# Patient Record
Sex: Female | Born: 1965 | Race: White | Hispanic: No | Marital: Married | Smoking: Never smoker
Health system: Southern US, Community
[De-identification: ages and names within clinical notes are randomized; demographics above are authoritative.]

## PROBLEM LIST (undated history)

## (undated) HISTORY — PX: CHOLECYSTECTOMY: SHX55

## (undated) HISTORY — PX: HERNIA REPAIR: SHX51

---

## 2004-10-04 ENCOUNTER — Ambulatory Visit: Payer: Self-pay

## 2007-05-01 ENCOUNTER — Ambulatory Visit: Payer: Self-pay

## 2008-08-10 ENCOUNTER — Ambulatory Visit: Payer: Self-pay

## 2009-08-18 ENCOUNTER — Ambulatory Visit: Payer: Self-pay

## 2011-11-06 ENCOUNTER — Ambulatory Visit: Payer: Self-pay

## 2013-07-13 ENCOUNTER — Inpatient Hospital Stay: Payer: Self-pay | Admitting: Surgery

## 2013-07-13 LAB — CBC
HGB: 12.1 g/dL (ref 12.0–16.0)
MCH: 29 pg (ref 26.0–34.0)
MCHC: 33.3 g/dL (ref 32.0–36.0)
MCV: 87 fL (ref 80–100)
RBC: 4.17 10*6/uL (ref 3.80–5.20)

## 2013-07-13 LAB — URINALYSIS, COMPLETE
Bilirubin,UR: NEGATIVE
Blood: NEGATIVE
Glucose,UR: NEGATIVE mg/dL (ref 0–75)
Ketone: NEGATIVE
Leukocyte Esterase: NEGATIVE
Ph: 6 (ref 4.5–8.0)
RBC,UR: <1 /HPF (ref 0–5)
Squamous Epithelial: 5
WBC UR: 4 /HPF (ref 0–5)

## 2013-07-13 LAB — COMPREHENSIVE METABOLIC PANEL
Albumin: 2.8 g/dL — ABNORMAL LOW (ref 3.4–5.0)
Alkaline Phosphatase: 115 U/L (ref 50–136)
BUN: 12 mg/dL (ref 7–18)
Calcium, Total: 8.8 mg/dL (ref 8.5–10.1)
Chloride: 106 mmol/L (ref 98–107)
EGFR (African American): >60
Glucose: 125 mg/dL — ABNORMAL HIGH (ref 65–99)
SGOT(AST): 16 U/L (ref 15–37)
Total Protein: 7.3 g/dL (ref 6.4–8.2)

## 2013-07-14 LAB — CBC WITH DIFFERENTIAL/PLATELET
Basophil #: 0.1 10*3/uL (ref 0.0–0.1)
Eosinophil #: 0.3 10*3/uL (ref 0.0–0.7)
HGB: 10.7 g/dL — ABNORMAL LOW (ref 12.0–16.0)
Lymphocyte #: 2.8 10*3/uL (ref 1.0–3.6)
Lymphocyte %: 29.1 %
Monocyte #: 0.7 x10 3/mm (ref 0.2–0.9)
Neutrophil #: 5.8 10*3/uL (ref 1.4–6.5)
Neutrophil %: 60.1 %
Platelet: 249 10*3/uL (ref 150–440)
WBC: 9.6 10*3/uL (ref 3.6–11.0)

## 2013-07-14 LAB — PREGNANCY, URINE: Pregnancy Test, Urine: NEGATIVE m[IU]/mL

## 2013-07-14 LAB — BASIC METABOLIC PANEL
BUN: 11 mg/dL (ref 7–18)
Co2: 25 mmol/L (ref 21–32)
EGFR (Non-African Amer.): >60
Potassium: 3.7 mmol/L (ref 3.5–5.1)

## 2013-07-16 LAB — PATHOLOGY REPORT

## 2014-01-18 ENCOUNTER — Ambulatory Visit: Payer: Self-pay | Admitting: Surgery

## 2014-02-04 ENCOUNTER — Ambulatory Visit: Payer: Self-pay | Admitting: Anesthesiology

## 2014-02-04 LAB — POTASSIUM: Potassium: 3.9 mmol/L (ref 3.5–5.1)

## 2014-03-04 ENCOUNTER — Ambulatory Visit: Payer: Self-pay | Admitting: Surgery

## 2015-04-15 NOTE — H&P (Signed)
History of Present Illness 49 yowf w/ a 5 day h/o constant RUQ pain and nausea. Pain was severe 5 days ago and WBC at [facility] ED was 14. Pain improved, but constant. Vomited a lot 5 days ago, but no vomiting since. Has been on Augmentin 875/125 since ED visit. Has had a low grade fever (< 100.0), but denies change in th color of her urine, stool, skin, or eyes. Had a similar episode 10 years ago when she had sludge, but no stones.   Past History MO Peripheral edema   Past Med/Surgical Hx:  ankle edema:   edema: pedal  none:   none:   ALLERGIES:  NKA: None  Iodine: Anaphylaxis  Shellfish: Anaphylaxis  HOME MEDICATIONS: Medication Instructions Status  hydrochlorothiazide 12.5 mg oral tablet 1 tab(s) orally once a day Active   Family and Social History:  Family History Non-Contributory   Social History negative tobacco, negative ETOH, married, 20 yo son at home, works at [employer]   Review of Systems:  Fever/Chills Yes   Cough No   Sputum No   Abdominal Pain Yes   Diarrhea No   Constipation No   Nausea/Vomiting Yes   SOB/DOE No   Chest Pain No   Dysuria No   Tolerating PT Yes   Tolerating Diet Yes  Nauseated   Medications/Allergies Reviewed Medications/Allergies reviewed   Physical Exam:  GEN well developed, well nourished, no acute distress, obese   HEENT pink conjunctivae, PERRL, hearing intact to voice, moist oral mucosa, Oropharynx clear   RESP normal resp effort  clear BS  no use of accessory muscles   CARD regular rate  no murmur  no JVD  no Rub   ABD positive tenderness  R sided tenderness - severe   LYMPH negative neck   SKIN normal to palpation, skin turgor good   NEURO cranial nerves intact, follows commands, motor/sensory function intact   PSYCH alert, A+O to time, place, person, good insight   Lab Results: Hepatic:  21-Jul-14 14:43   Bilirubin, Total 0.2  Alkaline Phosphatase 115  SGPT (ALT) 15  SGOT  (AST) 16  Total Protein, Serum 7.3  Albumin, Serum  2.8  Routine Chem:  21-Jul-14 14:43   Glucose, Serum  125  BUN 12  Creatinine (comp) 0.67  Sodium, Serum 139  Potassium, Serum  3.2  Chloride, Serum 106  CO2, Serum 30  Calcium (Total), Serum 8.8  Osmolality (calc) 279  eGFR (African American) >60  eGFR (Non-African American) >60 (eGFR values <78m/min/1.73 m2 may be an indication of chronic kidney disease (CKD). Calculated eGFR is useful in patients with stable renal function. The eGFR calculation will not be reliable in acutely ill patients when serum creatinine is changing rapidly. It is not useful in  patients on dialysis. The eGFR calculation may not be applicable to patients at the low and high extremes of body sizes, pregnant women, and vegetarians.)  Anion Gap  3  Lipase 127 (Result(s) reported on 13 Jul 2013 at 03:13PM.)  Routine UA:  21-Jul-14 18:20   Color (UA) Yellow  Clarity (UA) Hazy  Glucose (UA) Negative  Bilirubin (UA) Negative  Ketones (UA) Negative  Specific Gravity (UA) 1.021  Blood (UA) Negative  pH (UA) 6.0  Protein (UA) Negative  Nitrite (UA) Negative  Leukocyte Esterase (UA) Negative (Result(s) reported on 13 Jul 2013 at 06:48PM.)  RBC (UA) <1 /HPF  WBC (UA) 4 /HPF  Bacteria (UA) TRACE  Epithelial  Cells (UA) 5 /HPF  Mucous (UA) PRESENT (Result(s) reported on 13 Jul 2013 at 06:48PM.)  Routine Hem:  21-Jul-14 14:43   WBC (CBC)  11.6  RBC (CBC) 4.17  Hemoglobin (CBC) 12.1  Hematocrit (CBC) 36.3  Platelet Count (CBC) 284 (Result(s) reported on 13 Jul 2013 at 03:06PM.)  MCV 87  MCH 29.0  MCHC 33.3  RDW 13.3   Radiology Results: [place]:    21-Jul-14 18:00, US Abdomen Limited Survey  US Abdomen Limited Survey  REASON FOR EXAM:    RUQ pain  COMMENTS:   Body Site: GB and Fossa, CBD, Head of Pancreas    PROCEDURE: [place]  - US ABDOMEN LIMITED SURVEY  - Jul 13 2013  6:00PM     RESULT: Comparison: None.    Technique: Multiple grayscale and color  Doppler images wereobtained of   the right upper quadrant.    Findings:  The visualized pancreas is unremarkable. The pancreatic tail was obscured   by overlying bowel gas. The visualized liver is unremarkable. The main   portal vein is patent. The common bile duct measures 5 mm in diameter.   Multiple stones are demonstrated in the gallbladder. By report from the     ultrasound technologist, some were nonmobile in the gallbladder neck. No   gallbladder wall thickening or pericholecystic fluid. The sonographic   Percell Miller sign could not be accurately assessed secondary to the patient's   medicated state.    IMPRESSION:   Cholelithiasis. The sonographic Percell Miller sign could not be accurately   assessed. However, there was no gallbladder wall thickening or   pericholecystic fluid. Clinical correlation is suggested.       Dictation Site: 8        Verified By: Gregor Hams, M.D., MD  LabUnknown:  PACS Image    Assessment/Admission Diagnosis Subacute cholecystitis Hypokalemia   Plan Admit Replace K+ IV ABx Lap CCY tomorrow (pt aware of risk of opening, and 1/200 risk of CBD injury and wishes to proceed).   Electronic Signatures: Consuela Mimes (MD)  (Signed 21-Jul-14 19:53)  Authored: CHIEF COMPLAINT and HISTORY, PAST MEDICAL/SURGIAL HISTORY, ALLERGIES, HOME MEDICATIONS, FAMILY AND SOCIAL HISTORY, REVIEW OF SYSTEMS, PHYSICAL EXAM, LABS, Radiology, ASSESSMENT AND PLAN   Last Updated: 21-Jul-14 19:53 by Consuela Mimes (MD)

## 2015-04-15 NOTE — Discharge Summary (Signed)
PATIENT NAME:  Sarah Nixon, [name] MR#:  [id number] DATE OF BIRTH:  1966-12-01  DATE OF ADMISSION:  07/13/2013 DATE OF DISCHARGE:  07/16/2013  DISCHARGE DIAGNOSES:   1.  Acute cholecystitis status post laparoscopic cholecystectomy.  2.  History of peripheral edema.   DISCHARGE MEDICATIONS: 1.  Hydrochlorothiazide 12.5 p.o. daily.  2.  Percocet 1 to 2 tabs p.o. q.4 hours p.r.n. pain.  3.  Ibuprofen 600 mg t.i.d.   INDICATION FOR ADMISSION: Ms. [name] is a pleasant 49 year old female who presents with five days of right upper quadrant pain, which did not improved with antibiotics. She had an ultrasound which was consistent for cholelithiasis and a white blood cell count of 11.6. She was thus brought to the operating room suite for laparoscopic cholecystectomy.   HOSPITAL COURSE: She was admitted on July 21, resuscitated overnight. On July 22 underwent laparoscopic cholecystectomy. Postoperatively, there was some pain control issues. A JP output was recorded July 24. She was deemed suitable for discharge and was discharged home in satisfactory condition. She was tolerating good p.o. with good p.o. pain control and that time.   INSTRUCTIONS FOR DISCHARGE:  Ms. [name] is to be discharged with instructions to follow up with me in approximately one week. She is to call or return to the ED if she has increased abdominal pain, nausea, vomiting, redness, drainage from incision.   ____________________________ Si Raiderhristopher A. Loletha Bertini, MD cal:cc D: 07/27/2013 20:51:12 ET T: 07/27/2013 23:17:23 ET JOB#: [id number]  cc: Cristal Deerhristopher A. Carmichael Burdette, MD, <Dictator> Jarvis NewcomerHRISTOPHER A Tilford Deaton MD ELECTRONICALLY SIGNED 07/30/2013 20:32

## 2015-04-15 NOTE — Op Note (Signed)
PATIENT NAME:  Sarah Nixon, Sarah Nixon MR#:  [id number] DATE OF BIRTH:  1966/09/25  DATE OF PROCEDURE:  07/14/2013  ATTENDING PHYSICIAN:  Dr. Salome Holmeshris Motty Borin.   PREOPERATIVE DIAGNOSIS: Acute cholecystitis.   POSTOPERATIVE DIAGNOSIS:  Acute cholecystitis.  PROCEDURE PERFORMED: Laparoscopic cholecystectomy.   ANESTHESIA: General.   SPECIMENS: Gallbladder.   ESTIMATED BLOOD LOSS: 75 mL.   COMPLICATIONS: None.   INDICATION FOR SURGERY: [name] is a pleasant 49 year old female who presents with persistent right upper quadrant pain, leukocytosis and gallstones on physical exam.  She was admitted for a cholecystectomy.   DETAILS OF PROCEDURE: Is as follows:  Informed consent was obtained from Sarah Nixon. She was laid supine on the operating room table. She was induced. An endotracheal tube was placed, general anesthesia was administered. Her abdomen was prepped and draped in standard surgical fashion. A timeout was then performed correctly identifying the patient name, operative site and procedure to be performed. Supraumbilical incision was made. This was deepened down to the fascia. The fascia was incised. The peritoneum was entered. Two stay sutures were placed through the fasciotomy.  Hasson trocar was placed in the abdomen. The abdomen was insufflated. The gallbladder was visualized and noted to be hemorrhagic with a rind around it. An  11 mm subxiphoid port was placed under direct visualization, and two 5 mm right subcostal ports at the midclavicular and anterior axillary line. The gallbladder was then aspirated so that it  could be grasped. It was then retracted over the dome of the liver. The cystic artery and cystic duct were dissected out. A critical view was obtained. The artery was small and, nevertheless, clipped, as well as the duct, and both were ligated. I then began removing the gallbladder off the gallbladder fossa. A second arterial structure, which was found to be entering the gallbladder, was  then encountered and clipped and ligated. The gallbladder was then taken off the gallbladder fossa. It was friable and attempts were made not to peel off directly, but it did somewhat peel off, and there was some oozing in the gallbladder fossa. The gallbladder was then taken out through the umbilical port site with an Endo Catch bag. The port site did have to be significantly enlarged as well as the fascia to be able to remove the gallbladder. The gallbladder fossa was then inspected and noted to be oozing, which was cauterized and later Surgiflo was placed to provide hemostasis. After I was sure hemostasis occurred, due to the friability of the gallbladder and the oozing of the gallbladder fossa, I did place a JP drain through the lateralmost port site into the gallbladder fossa, sutured in place with 3-0 nylon suture. After I was satisfied with hemostasis, I removed all port sites under direct visualization. The supraumbilical port site fascia was closed with a 0 Vicryl on a UR-6. All skin incisions were then closed with 4-0 deep dermal Monocryl. Steri-Strips, Telfa gauze and Tegaderm were then placed over all the wounds. The patient was then awoken, extubated and brought to the postanesthesia care unit. There were no immediate complications. Needle, sponge, and instrument counts were correct at the end of the procedure.    ____________________________ Si Raiderhristopher A. Cairo Agostinelli, MD cal:dmm D: 07/15/2013 12:18:30 ET T: 07/15/2013 12:35:52 ET JOB#: [id number]  cc: Cristal Deerhristopher A. Kierstyn Baranowski, MD, <Dictator> Jarvis NewcomerHRISTOPHER A Rifky Lapre MD ELECTRONICALLY SIGNED 07/18/2013 11:35

## 2015-04-16 NOTE — Op Note (Signed)
PATIENT NAME:  Sarah Nixon, Sarah Nixon MR#:  [id number] DATE OF BIRTH:  [birth year]  DATE OF PROCEDURE:  03/04/2014  ATTENDING PHYSICIAN:  Cristal Deerhristopher A. Gilmore List, MD  PREOPERATIVE DIAGNOSIS:  Incisional hernia.   POSTOPERATIVE DIAGNOSIS:  Incisional hernia.   PROCEDURE PERFORMED:  Open incisional hernia repair with mesh.   ANESTHESIA:  General.   ESTIMATED BLOOD LOSS:  15 mL.   COMPLICATIONS:  None.   SPECIMENS:  None.   INDICATION FOR SURGERY:  Sarah Nixon is a pleasant 49 year old female with a history of laparoscopic cholecystectomy who presented with a significant incisional hernia. The decision was made to repair her incisional hernia.   DETAILS OF PROCEDURE:  Informed consent was obtained. Sarah Nixon was brought to the Operating Room suite. She was laid supine on the Operating Room table. She was induced. Endotracheal tube was placed, general anesthesia was administered. Her abdomen was then prepped and draped in standard surgical fashion. A time-out was then performed correctly identifying the patient name, operative site and procedure to be performed. The hernia was then palpated. It was easily reduced. The fascial defect was palpated. I then made an incision over the fascial defect. The incision was deepened down to the hernia sac. The hernia sac was then surrounded and opened. There was some omentum stuck to the inside and this was carefully removed from the sac. The sac was then reduced. The fascia was then cleared off. The defect was measured. It was 3 x 3 cm. The decision was made then place an 8-cm Ventralex mesh. This was placed deep into the abdominal cavity. The defect was then closed. All of her __________ held in the pocket of the incision using an interrupted 0 Ethibond in a longitudinal orientation. The sutures then tied. The tails were then clipped off. The repair was then examined and noted to be hemostatic. The wound was then closed in 3 layers, a deep layer of interrupted 3-0 Vicryl  and a 3-0 Vicryl deep dermal and 4-0 Monocryl subcuticular. Steri-Strips, Telfa gauze and then Tegaderm were used to complete dressing. The patient was then awoken, extubated and brought to the postanesthesia care unit. There were no immediate complications. Needle, sponge, and instrument counts were correct at the end of the procedure.   ____________________________ Si Raiderhristopher A. Luwanda Starr, MD cal:jm D: 03/05/2014 19:20:04 ET T: 03/06/2014 12:28:13 ET JOB#: [id number]  cc: Cristal Deerhristopher A. Digby Groeneveld, MD, <Dictator> Jarvis NewcomerHRISTOPHER A Meadow Abramo MD ELECTRONICALLY SIGNED 03/07/2014 19:23

## 2015-08-18 ENCOUNTER — Other Ambulatory Visit: Payer: Self-pay | Admitting: Obstetrics and Gynecology

## 2015-08-18 DIAGNOSIS — Z1231 Encounter for screening mammogram for malignant neoplasm of breast: Secondary | ICD-10-CM

## 2015-08-23 ENCOUNTER — Ambulatory Visit
Admission: RE | Admit: 2015-08-23 | Discharge: 2015-08-23 | Disposition: A | Discharge status: Home / Self Care | Payer: 59 | Source: Ambulatory Visit | Attending: Obstetrics and Gynecology | Admitting: Obstetrics and Gynecology

## 2015-08-23 DIAGNOSIS — Z1231 Encounter for screening mammogram for malignant neoplasm of breast: Secondary | ICD-10-CM

## 2015-10-27 ENCOUNTER — Other Ambulatory Visit: Payer: Self-pay | Admitting: Internal Medicine

## 2015-10-27 DIAGNOSIS — R519 Headache, unspecified: Secondary | ICD-10-CM

## 2015-10-27 DIAGNOSIS — R51 Headache: Principal | ICD-10-CM

## 2015-11-09 ENCOUNTER — Ambulatory Visit
Admission: RE | Admit: 2015-11-09 | Discharge: 2015-11-09 | Disposition: A | Discharge status: Home / Self Care | Payer: 59 | Source: Ambulatory Visit | Attending: Internal Medicine | Admitting: Internal Medicine

## 2015-11-09 ENCOUNTER — Other Ambulatory Visit: Payer: Self-pay | Admitting: Internal Medicine

## 2015-11-09 DIAGNOSIS — R519 Headache, unspecified: Secondary | ICD-10-CM

## 2015-11-09 DIAGNOSIS — R51 Headache: Secondary | ICD-10-CM | POA: Diagnosis not present

## 2015-11-09 MED ORDER — GADOBENATE DIMEGLUMINE 529 MG/ML IV SOLN: 20.0000 mL | Freq: Once | INTRAVENOUS | Status: DC | PRN

## 2016-08-21 ENCOUNTER — Other Ambulatory Visit: Payer: Self-pay | Admitting: Obstetrics and Gynecology

## 2016-08-21 DIAGNOSIS — Z1231 Encounter for screening mammogram for malignant neoplasm of breast: Secondary | ICD-10-CM

## 2016-09-10 ENCOUNTER — Ambulatory Visit
Admission: RE | Admit: 2016-09-10 | Discharge: 2016-09-10 | Disposition: A | Discharge status: Home / Self Care | Payer: 59 | Source: Ambulatory Visit | Attending: Obstetrics and Gynecology | Admitting: Obstetrics and Gynecology

## 2016-09-10 DIAGNOSIS — Z1231 Encounter for screening mammogram for malignant neoplasm of breast: Secondary | ICD-10-CM | POA: Diagnosis not present

## 2017-08-21 ENCOUNTER — Other Ambulatory Visit: Payer: Self-pay | Admitting: Obstetrics and Gynecology

## 2017-08-21 DIAGNOSIS — Z1231 Encounter for screening mammogram for malignant neoplasm of breast: Secondary | ICD-10-CM

## 2017-09-18 ENCOUNTER — Ambulatory Visit
Admission: RE | Admit: 2017-09-18 | Discharge: 2017-09-18 | Disposition: A | Discharge status: Home / Self Care | Payer: 59 | Source: Ambulatory Visit | Attending: Obstetrics and Gynecology | Admitting: Obstetrics and Gynecology

## 2017-09-18 DIAGNOSIS — Z1231 Encounter for screening mammogram for malignant neoplasm of breast: Secondary | ICD-10-CM | POA: Diagnosis not present

## 2018-10-14 ENCOUNTER — Other Ambulatory Visit: Payer: Self-pay | Admitting: Obstetrics and Gynecology

## 2018-10-14 DIAGNOSIS — Z1231 Encounter for screening mammogram for malignant neoplasm of breast: Secondary | ICD-10-CM

## 2018-12-29 ENCOUNTER — Ambulatory Visit
Admission: RE | Admit: 2018-12-29 | Discharge: 2018-12-29 | Disposition: A | Discharge status: Home / Self Care | Payer: Managed Care, Other (non HMO) | Source: Ambulatory Visit | Attending: Obstetrics and Gynecology | Admitting: Obstetrics and Gynecology

## 2018-12-29 DIAGNOSIS — Z1231 Encounter for screening mammogram for malignant neoplasm of breast: Secondary | ICD-10-CM | POA: Insufficient documentation

## 2019-10-15 ENCOUNTER — Other Ambulatory Visit: Payer: Self-pay

## 2019-10-15 DIAGNOSIS — Z20822 Contact with and (suspected) exposure to covid-19: Secondary | ICD-10-CM

## 2019-10-17 LAB — NOVEL CORONAVIRUS, NAA: SARS-CoV-2, NAA: DETECTED — AB

## 2020-09-29 ENCOUNTER — Other Ambulatory Visit: Payer: Self-pay | Admitting: Internal Medicine

## 2020-09-29 ENCOUNTER — Other Ambulatory Visit: Payer: Self-pay | Admitting: Obstetrics and Gynecology

## 2020-09-29 DIAGNOSIS — Z1231 Encounter for screening mammogram for malignant neoplasm of breast: Secondary | ICD-10-CM

## 2020-11-01 ENCOUNTER — Ambulatory Visit
Admission: RE | Admit: 2020-11-01 | Discharge: 2020-11-01 | Disposition: A | Discharge status: Home / Self Care | Payer: Managed Care, Other (non HMO) | Source: Ambulatory Visit | Attending: Obstetrics and Gynecology | Admitting: Obstetrics and Gynecology

## 2020-11-01 ENCOUNTER — Other Ambulatory Visit: Payer: Self-pay

## 2020-11-01 DIAGNOSIS — Z1231 Encounter for screening mammogram for malignant neoplasm of breast: Secondary | ICD-10-CM | POA: Diagnosis present

## 2020-11-08 ENCOUNTER — Other Ambulatory Visit: Payer: Self-pay | Admitting: Obstetrics and Gynecology

## 2020-11-08 DIAGNOSIS — N6489 Other specified disorders of breast: Secondary | ICD-10-CM

## 2020-11-08 DIAGNOSIS — R928 Other abnormal and inconclusive findings on diagnostic imaging of breast: Secondary | ICD-10-CM

## 2020-12-06 ENCOUNTER — Telehealth: Payer: Self-pay | Admitting: *Deleted

## 2020-12-06 NOTE — Telephone Encounter (Signed)
CALLED PT AGAIN REGARDING AV / MAMMO . SHE IS AWARE OF AV - BUT AT THIS TIME SHE DOES NOT WANT TO COME BACK IN FOR THE AV - IF SHE CHANGES HER MIND SHE WCB - SHE SAID SHE HAS ALREADY DISCUSSED THIS WITH HER OBGYN

## 2022-01-03 ENCOUNTER — Other Ambulatory Visit: Payer: Self-pay | Admitting: Obstetrics and Gynecology

## 2022-01-03 DIAGNOSIS — N6489 Other specified disorders of breast: Secondary | ICD-10-CM

## 2022-01-29 ENCOUNTER — Ambulatory Visit
Admission: RE | Admit: 2022-01-29 | Discharge: 2022-01-29 | Disposition: A | Discharge status: Home / Self Care | Payer: Managed Care, Other (non HMO) | Source: Ambulatory Visit | Attending: Obstetrics and Gynecology | Admitting: Obstetrics and Gynecology

## 2022-01-29 ENCOUNTER — Other Ambulatory Visit: Payer: Self-pay

## 2022-01-29 DIAGNOSIS — N6489 Other specified disorders of breast: Secondary | ICD-10-CM | POA: Diagnosis not present

## 2022-05-11 ENCOUNTER — Ambulatory Visit: Payer: Managed Care, Other (non HMO) | Admitting: Anesthesiology

## 2022-05-11 ENCOUNTER — Encounter
Admission: RE | Disposition: A | Discharge status: Home / Self Care | Payer: Self-pay | Source: Home / Self Care | Attending: Gastroenterology

## 2022-05-11 ENCOUNTER — Ambulatory Visit
Admission: RE | Admit: 2022-05-11 | Discharge: 2022-05-11 | Disposition: A | Discharge status: Home / Self Care | Payer: Managed Care, Other (non HMO) | Attending: Gastroenterology | Admitting: Gastroenterology

## 2022-05-11 ENCOUNTER — Encounter: Payer: Self-pay | Admitting: Gastroenterology

## 2022-05-11 DIAGNOSIS — K635 Polyp of colon: Secondary | ICD-10-CM | POA: Insufficient documentation

## 2022-05-11 DIAGNOSIS — K640 First degree hemorrhoids: Secondary | ICD-10-CM | POA: Diagnosis not present

## 2022-05-11 DIAGNOSIS — Z1211 Encounter for screening for malignant neoplasm of colon: Secondary | ICD-10-CM | POA: Diagnosis present

## 2022-05-11 DIAGNOSIS — K5730 Diverticulosis of large intestine without perforation or abscess without bleeding: Secondary | ICD-10-CM | POA: Diagnosis not present

## 2022-05-11 DIAGNOSIS — Z6841 Body Mass Index (BMI) 40.0 and over, adult: Secondary | ICD-10-CM | POA: Diagnosis not present

## 2022-05-11 HISTORY — PX: COLONOSCOPY WITH PROPOFOL: SHX5780

## 2022-05-11 SURGERY — COLONOSCOPY WITH PROPOFOL
Anesthesia: General

## 2022-05-11 MED ORDER — SODIUM CHLORIDE 0.9 % IV SOLN: INTRAVENOUS | Status: DC

## 2022-05-11 MED ORDER — PROPOFOL 10 MG/ML IV BOLUS
INTRAVENOUS | Status: DC | PRN
  Administered 2022-05-11: 100 mg via INTRAVENOUS
  Administered 2022-05-11 (×6): 40 mg via INTRAVENOUS
  Administered 2022-05-11 (×2): 50 mg via INTRAVENOUS
  Administered 2022-05-11: 40 mg via INTRAVENOUS

## 2022-05-11 NOTE — H&P (Signed)
Pre-Procedure H&P   Patient ID: Sarah Nixon is a 56 y.o. female.  Gastroenterology Provider: Jaynie Collins, DO  Referring Provider: Dr. Hyacinth Meeker PCP: Danella Penton, MD  Date: 05/11/2022  HPI [name] is a 56 y.o. female who presents today for Colonoscopy for Initial screening colonoscopy. Normal bm w/o melena hematochezia diarrhea or constipation No fhx crc or colon polyps.  History reviewed. No pertinent past medical history.  Past Surgical History:  Procedure Laterality Date   CHOLECYSTECTOMY     HERNIA REPAIR      Family History No h/o GI disease or malignancy  Review of Systems  Constitutional:  Negative for activity change, appetite change, chills, diaphoresis, fatigue, fever and unexpected weight change.  HENT:  Negative for trouble swallowing and voice change.   Respiratory:  Negative for shortness of breath and wheezing.   Cardiovascular:  Negative for chest pain, palpitations and leg swelling.  Gastrointestinal:  Negative for abdominal distention, abdominal pain, anal bleeding, blood in stool, constipation, diarrhea, nausea, rectal pain and vomiting.  Musculoskeletal:  Negative for arthralgias and myalgias.  Skin:  Negative for color change and pallor.  Neurological:  Negative for dizziness, syncope and weakness.  Psychiatric/Behavioral:  Negative for confusion.   All other systems reviewed and are negative.   Medications No current facility-administered medications on file prior to encounter.   Current Outpatient Medications on File Prior to Encounter  Medication Sig Dispense Refill   estradiol (ESTRACE) 1 MG tablet Take 1 mg by mouth daily.     hydrochlorothiazide (HYDRODIURIL) 25 MG tablet Take 25 mg by mouth daily.     LACTOBACILLUS PO Take by mouth.     progesterone (PROMETRIUM) 200 MG capsule Take 200 mg by mouth daily.     Semaglutide-Weight Management 0.25 MG/0.5ML SOAJ Inject 0.25 mg into the skin once a week. (Patient not taking:  Reported on 05/11/2022)      Pertinent medications related to GI and procedure were reviewed by me with the patient prior to the procedure   Current Facility-Administered Medications:    0.9 %  sodium chloride infusion, , Intravenous, Continuous, Jaynie Collins, DO, Last Rate: 20 mL/hr at 05/11/22 0817, New Bag at 05/11/22 6269      Allergies  Allergen Reactions   Iodine Anaphylaxis   Shellfish Allergy    Allergies were reviewed by me prior to the procedure  Objective   Body mass index is 47.72 kg/m. Vitals:   05/11/22 0805  BP: 120/85  Pulse: 92  Resp: 15  Temp: (!) 97.2 F (36.2 C)  SpO2: 98%  Weight: 126.1 kg  Height: 5\' 4"  (1.626 m)     Physical Exam Vitals and nursing note reviewed.  Constitutional:      General: She is not in acute distress.    Appearance: Normal appearance. She is obese. She is not ill-appearing, toxic-appearing or diaphoretic.  HENT:     Head: Normocephalic and atraumatic.     Nose: Nose normal.     Mouth/Throat:     Mouth: Mucous membranes are moist.     Pharynx: Oropharynx is clear.  Eyes:     General: No scleral icterus.    Extraocular Movements: Extraocular movements intact.  Cardiovascular:     Rate and Rhythm: Normal rate and regular rhythm.     Heart sounds: Normal heart sounds. No murmur heard.   No friction rub. No gallop.  Pulmonary:     Effort: Pulmonary effort is normal. No respiratory  distress.     Breath sounds: Normal breath sounds. No wheezing, rhonchi or rales.  Abdominal:     General: Bowel sounds are normal. There is no distension.     Palpations: Abdomen is soft.     Tenderness: There is no abdominal tenderness. There is no guarding or rebound.  Musculoskeletal:     Cervical back: Neck supple.     Right lower leg: No edema.     Left lower leg: No edema.  Skin:    General: Skin is warm and dry.     Coloration: Skin is not jaundiced or pale.  Neurological:     General: No focal deficit present.      Mental Status: She is alert and oriented to person, place, and time. Mental status is at baseline.  Psychiatric:        Mood and Affect: Mood normal.        Behavior: Behavior normal.        Thought Content: Thought content normal.        Judgment: Judgment normal.     Assessment:  [name] is a 56 y.o. female  who presents today for Colonoscopy for Initial screening colonoscopy.  Plan:  Colonoscopy with possible intervention today  Colonoscopy with possible biopsy, control of bleeding, polypectomy, and interventions as necessary has been discussed with the patient/patient representative. Informed consent was obtained from the patient/patient representative after explaining the indication, nature, and risks of the procedure including but not limited to death, bleeding, perforation, missed neoplasm/lesions, cardiorespiratory compromise, and reaction to medications. Opportunity for questions was given and appropriate answers were provided. Patient/patient representative has verbalized understanding is amenable to undergoing the procedure.   Jaynie Collins, DO  [facility]  Portions of the record may have been created with voice recognition software. Occasional wrong-word or 'sound-a-like' substitutions may have occurred due to the inherent limitations of voice recognition software.  Read the chart carefully and recognize, using context, where substitutions may have occurred.

## 2022-05-11 NOTE — Anesthesia Postprocedure Evaluation (Signed)
Anesthesia Post Note  Patient: Sarah Nixon  Procedure(s) Performed: COLONOSCOPY WITH PROPOFOL  Patient location during evaluation: Endoscopy Anesthesia Type: General Level of consciousness: awake and alert Pain management: pain level controlled Vital Signs Assessment: post-procedure vital signs reviewed and stable Respiratory status: spontaneous breathing, nonlabored ventilation, respiratory function stable and patient connected to nasal cannula oxygen Cardiovascular status: blood pressure returned to baseline and stable Postop Assessment: no apparent nausea or vomiting Anesthetic complications: no   No notable events documented.   Last Vitals:  Vitals:   05/11/22 0949 05/11/22 0959  BP: 101/84   Pulse:    Resp:    Temp:    SpO2: 100% 100%    Last Pain:  Vitals:   05/11/22 0959  TempSrc:   PainSc: 0-No pain                 Precious Haws Rameses Ou

## 2022-05-11 NOTE — Op Note (Signed)
[facility] Patient Name: Sarah Nixon Procedure Date: 05/11/2022 8:49 AM MRN: [id number] Account #: [id number] Date of Birth: 04-Sep-1966 Admit Type: Outpatient Age: 56 Room: [facility] ENDO ROOM 1 Gender: Female Note Status: Finalized Instrument Name: Colonoscope [id number] Procedure:             Colonoscopy Indications:           Screening for colorectal malignant neoplasm Providers:             Annamaria Helling DO, DO Medicines:             Monitored Anesthesia Care Complications:         No immediate complications. Estimated blood loss:                         Minimal. Procedure:             Pre-Anesthesia Assessment:                        - Prior to the procedure, a History and Physical was                         performed, and patient medications and allergies were                         reviewed. The patient is competent. The risks and                         benefits of the procedure and the sedation options and                         risks were discussed with the patient. All questions                         were answered and informed consent was obtained.                         Patient identification and proposed procedure were                         verified by the physician, the nurse, the anesthetist                         and the technician in the endoscopy suite. Mental                         Status Examination: alert and oriented. Airway                         Examination: normal oropharyngeal airway and neck                         mobility. Respiratory Examination: clear to                         auscultation. CV Examination: RRR, no murmurs, no S3                         or S4. Prophylactic Antibiotics: The patient does not  require prophylactic antibiotics. Prior                         Anticoagulants: The patient has taken no previous                         anticoagulant or antiplatelet agents. ASA  Grade                         Assessment: III - A patient with severe systemic                         disease. After reviewing the risks and benefits, the                         patient was deemed in satisfactory condition to                         undergo the procedure. The anesthesia plan was to use                         monitored anesthesia care (MAC). Immediately prior to                         administration of medications, the patient was                         re-assessed for adequacy to receive sedatives. The                         heart rate, respiratory rate, oxygen saturations,                         blood pressure, adequacy of pulmonary ventilation, and                         response to care were monitored throughout the                         procedure. The physical status of the patient was                         re-assessed after the procedure.                        After obtaining informed consent, the colonoscope was                         passed under direct vision. Throughout the procedure,                         the patient's blood pressure, pulse, and oxygen                         saturations were monitored continuously. The                         Colonoscope was introduced through the anus and  advanced to the the terminal ileum, with                         identification of the appendiceal orifice and IC                         valve. The colonoscopy was performed without                         difficulty. The patient tolerated the procedure well.                         The quality of the bowel preparation was evaluated                         using the BBPS [facility] Bowel Preparation Scale) with                         scores of: Right Colon = 3, Transverse Colon = 3 and                         Left Colon = 3 (entire mucosa seen well with no                         residual staining, small fragments of stool or opaque                          liquid). The total BBPS score equals 9. The terminal                         ileum, ileocecal valve, appendiceal orifice, and                         rectum were photographed. Findings:      The perianal and digital rectal examinations were normal. Pertinent       negatives include normal sphincter tone.      The terminal ileum appeared normal. Estimated blood loss: none.      Multiple small-mouthed diverticula were found in the recto-sigmoid colon       and sigmoid colon. Estimated blood loss: none.      Non-bleeding internal hemorrhoids were found during retroflexion. The       hemorrhoids were Grade I (internal hemorrhoids that do not prolapse).       Estimated blood loss: none.      A 2 to 3 mm polyp was found in the descending colon. The polyp was       sessile. The polyp was removed with a cold snare. Resection and       retrieval were complete. Estimated blood loss was minimal.      Three sessile polyps were found in the transverse colon. The polyps were       2 to 3 mm in size. These polyps were removed with a cold snare.       Resection and retrieval were complete. Estimated blood loss was minimal.      The exam was otherwise without abnormality on direct and retroflexion       views. Impression:            -  The examined portion of the ileum was normal.                        - Diverticulosis in the recto-sigmoid colon and in the                         sigmoid colon.                        - Non-bleeding internal hemorrhoids.                        - One 2 to 3 mm polyp in the descending colon, removed                         with a cold snare. Resected and retrieved.                        - Three 2 to 3 mm polyps in the transverse colon,                         removed with a cold snare. Resected and retrieved.                        - The examination was otherwise normal on direct and                         retroflexion views. Recommendation:        -  Discharge patient to home.                        - Resume previous diet.                        - Continue present medications.                        - No aspirin, ibuprofen, naproxen, or other                         non-steroidal anti-inflammatory drugs for 5 days after                         polyp removal.                        - Await pathology results.                        - Repeat colonoscopy for surveillance based on                         pathology results.                        - Return to referring physician as previously                         scheduled.                        -  The findings and recommendations were discussed with                         the patient. Procedure Code(s):     --- Professional ---                        [phone], Colonoscopy, flexible; with removal of                         tumor(s), polyp(s), or other lesion(s) by snare                         technique Diagnosis Code(s):     --- Professional ---                        K63.5, Polyp of colon                        Z12.11, Encounter for screening for malignant neoplasm                         of colon                        K64.0, First degree hemorrhoids                        K57.30, Diverticulosis of large intestine without                         perforation or abscess without bleeding CPT copyright 2019 [employer]. All rights reserved. The codes documented in this report are preliminary and upon coder review may  be revised to meet current compliance requirements. Attending Participation:      I personally performed the entire procedure. Volney American, DO Annamaria Helling DO, DO 05/11/2022 9:40:37 AM This report has been signed electronically. Number of Addenda: 0 Note Initiated On: 05/11/2022 8:49 AM Scope Withdrawal Time: 0 hours 16 minutes 23 seconds  Total Procedure Duration: 0 hours 27 minutes 28 seconds  Estimated Blood Loss:  Estimated blood loss was  minimal.      [facility]

## 2022-05-11 NOTE — Interval H&P Note (Signed)
History and Physical Interval Note: Preprocedure H&P from 05/11/22  was reviewed and there was no interval change after seeing and examining the patient.  Written consent was obtained from the patient after discussion of risks, benefits, and alternatives. Patient has consented to proceed with Colonoscopy with possible intervention   05/11/2022 9:00 AM  Sarah Nixon  has presented today for surgery, with the diagnosis of Colon Cancer Screening.  The various methods of treatment have been discussed with the patient and family. After consideration of risks, benefits and other options for treatment, the patient has consented to  Procedure(s): COLONOSCOPY WITH PROPOFOL (N/A) as a surgical intervention.  The patient's history has been reviewed, patient examined, no change in status, stable for surgery.  I have reviewed the patient's chart and labs.  Questions were answered to the patient's satisfaction.     Jaynie Collins

## 2022-05-11 NOTE — Transfer of Care (Signed)
Immediate Anesthesia Transfer of Care Note  Patient: Sarah Nixon  Procedure(s) Performed: COLONOSCOPY WITH PROPOFOL  Patient Location: Endoscopy Unit  Anesthesia Type:General  Level of Consciousness: awake, alert  and oriented  Airway & Oxygen Therapy: Patient Spontanous Breathing and Patient connected to nasal cannula oxygen  Post-op Assessment: Report given to RN, Post -op Vital signs reviewed and stable and Patient moving all extremities  Post vital signs: Reviewed and stable  Last Vitals:  Vitals Value Taken Time  BP 131/97 05/11/22 0940  Temp 36.1 C 05/11/22 0939  Pulse 81 05/11/22 0940  Resp 15 05/11/22 0940  SpO2 97 % 05/11/22 0940  Vitals shown include unvalidated device data.  Last Pain:  Vitals:   05/11/22 0939  TempSrc: Temporal  PainSc:          Complications: No notable events documented.

## 2022-05-11 NOTE — Anesthesia Preprocedure Evaluation (Signed)
Anesthesia Evaluation  Patient identified by MRN, date of birth, ID band Patient awake    Reviewed: Allergy & Precautions, NPO status , Patient's Chart, lab work & pertinent test results  History of Anesthesia Complications Negative for: history of anesthetic complications  Airway Mallampati: III  TM Distance: >3 FB Neck ROM: full    Dental  (+) Chipped   Pulmonary neg pulmonary ROS, neg shortness of breath,    Pulmonary exam normal        Cardiovascular Exercise Tolerance: Good (-) Past MI negative cardio ROS Normal cardiovascular exam     Neuro/Psych negative neurological ROS  negative psych ROS   GI/Hepatic negative GI ROS, Neg liver ROS, neg GERD  ,  Endo/Other  Morbid obesity  Renal/GU negative Renal ROS  negative genitourinary   Musculoskeletal   Abdominal   Peds  Hematology negative hematology ROS (+)   Anesthesia Other Findings History reviewed. No pertinent past medical history.  Past Surgical History: No date: CHOLECYSTECTOMY No date: HERNIA REPAIR  BMI    Body Mass Index: 47.72 kg/m      Reproductive/Obstetrics negative OB ROS                             Anesthesia Physical Anesthesia Plan  ASA: 3  Anesthesia Plan: General   Post-op Pain Management:    Induction: Intravenous  PONV Risk Score and Plan: Propofol infusion and TIVA  Airway Management Planned: Natural Airway and Nasal Cannula  Additional Equipment:   Intra-op Plan:   Post-operative Plan:   Informed Consent: I have reviewed the patients History and Physical, chart, labs and discussed the procedure including the risks, benefits and alternatives for the proposed anesthesia with the patient or authorized representative who has indicated his/her understanding and acceptance.     Dental Advisory Given  Plan Discussed with: Anesthesiologist, CRNA and Surgeon  Anesthesia Plan Comments:  (Patient consented for risks of anesthesia including but not limited to:  - adverse reactions to medications - risk of airway placement if required - damage to eyes, teeth, lips or other oral mucosa - nerve damage due to positioning  - sore throat or hoarseness - Damage to heart, brain, nerves, lungs, other parts of body or loss of life  Patient voiced understanding.)        Anesthesia Quick Evaluation

## 2022-05-14 LAB — SURGICAL PATHOLOGY

## 2023-03-12 ENCOUNTER — Other Ambulatory Visit: Payer: Self-pay | Admitting: Obstetrics and Gynecology

## 2023-03-12 DIAGNOSIS — Z1231 Encounter for screening mammogram for malignant neoplasm of breast: Secondary | ICD-10-CM

## 2023-03-29 ENCOUNTER — Ambulatory Visit
Admission: RE | Admit: 2023-03-29 | Discharge: 2023-03-29 | Disposition: A | Discharge status: Home / Self Care | Payer: Managed Care, Other (non HMO) | Source: Ambulatory Visit | Attending: Obstetrics and Gynecology | Admitting: Obstetrics and Gynecology

## 2023-03-29 DIAGNOSIS — Z1231 Encounter for screening mammogram for malignant neoplasm of breast: Secondary | ICD-10-CM | POA: Diagnosis not present

## 2023-08-18 IMAGING — MG DIGITAL DIAGNOSTIC BILAT W/ TOMO W/ CAD
8 series · 8 of 24 positions shown · non-contrast
Comparison: Previous exam(s).

CLINICAL DATA: Patient presents for bilateral diagnostic
examination to evaluate a possible right breast asymmetry for which
patient never returned from screening evaluation 3131.

EXAM:
DIGITAL DIAGNOSTIC BILATERAL MAMMOGRAM WITH TOMOSYNTHESIS AND CAD
TECHNIQUE: Bilateral digital diagnostic mammography and breast tomosynthesis
was performed. The images were evaluated with computer-aided
detection.

[L CC synth-2D]
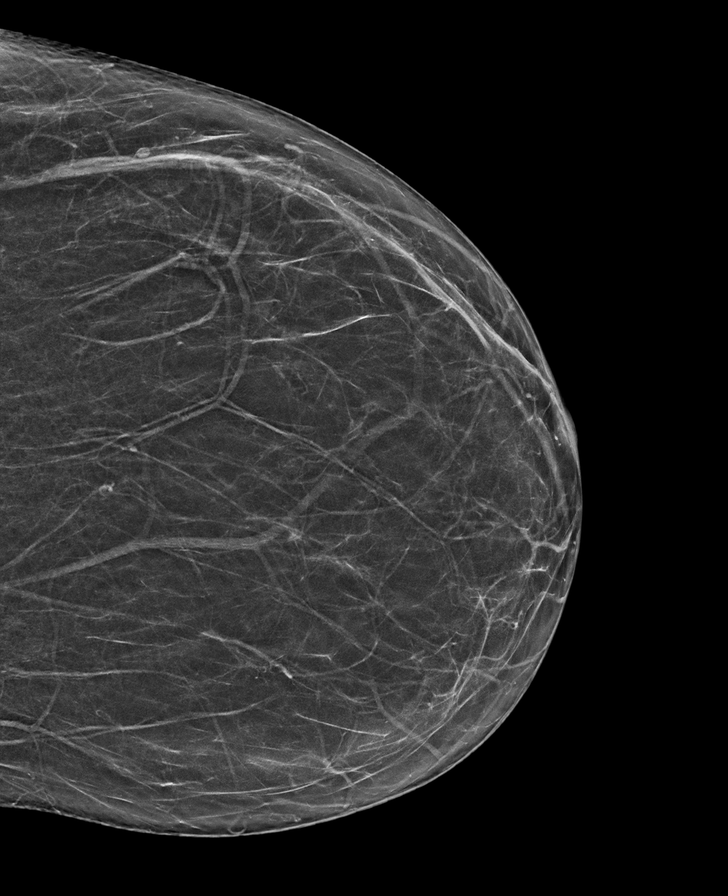

[R CC synth-2D]
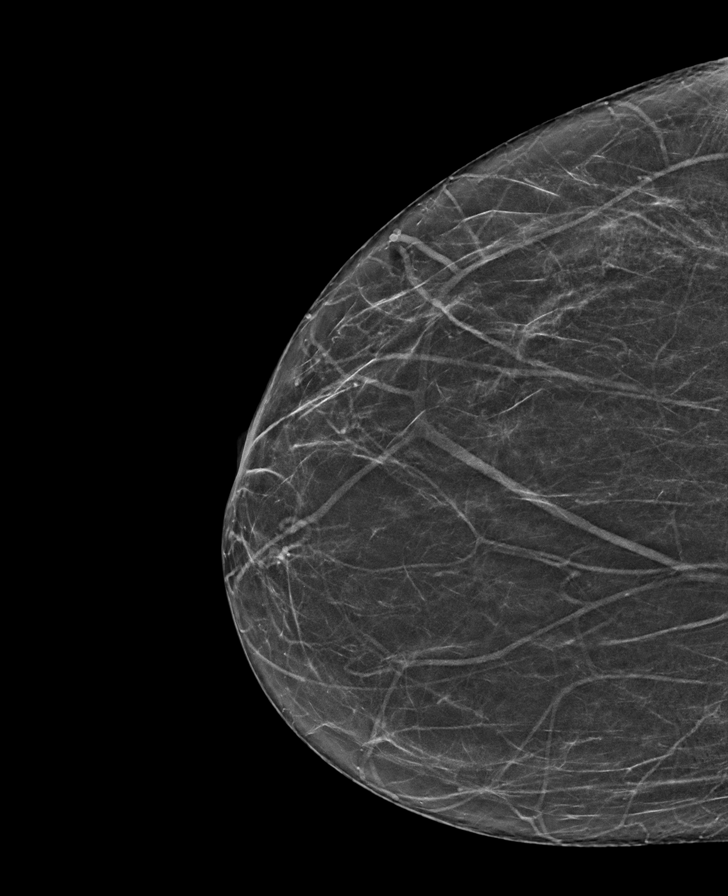

[R MLO synth-2D]
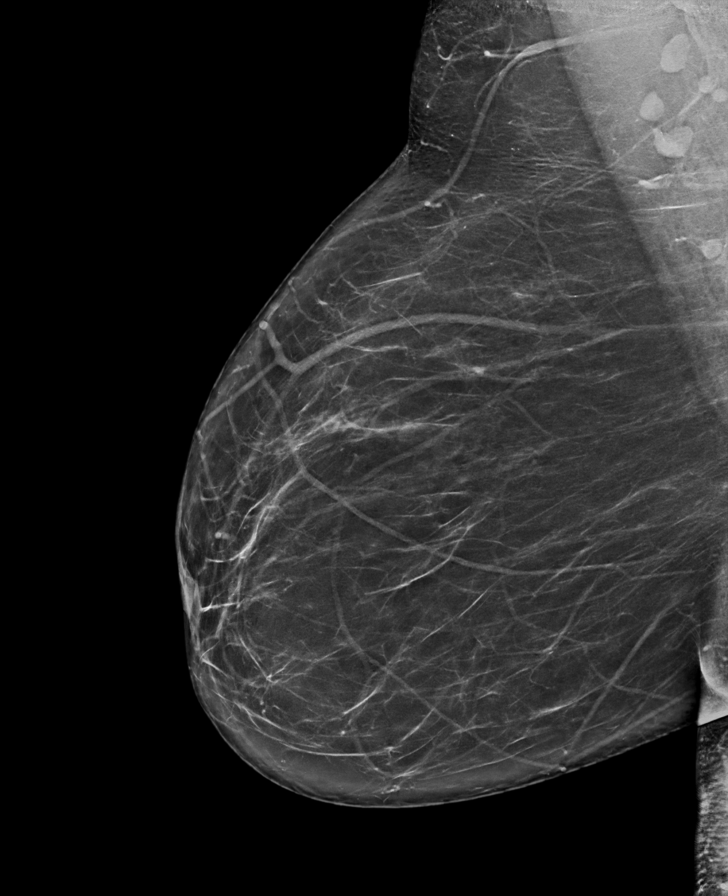

[L MLO synth-2D]
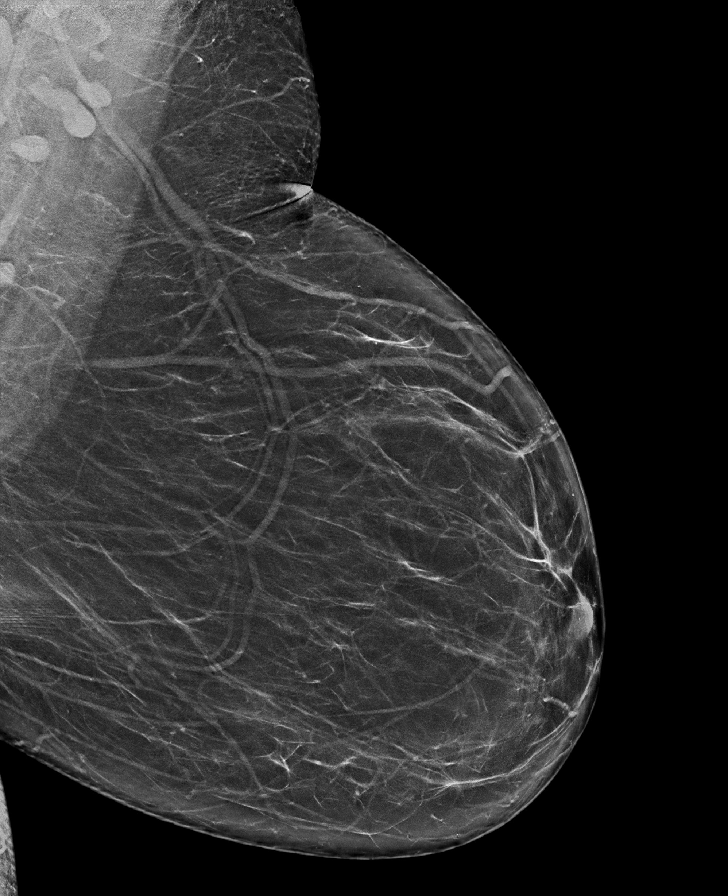

[L MLO tomo · tomo slice 41/82.0]
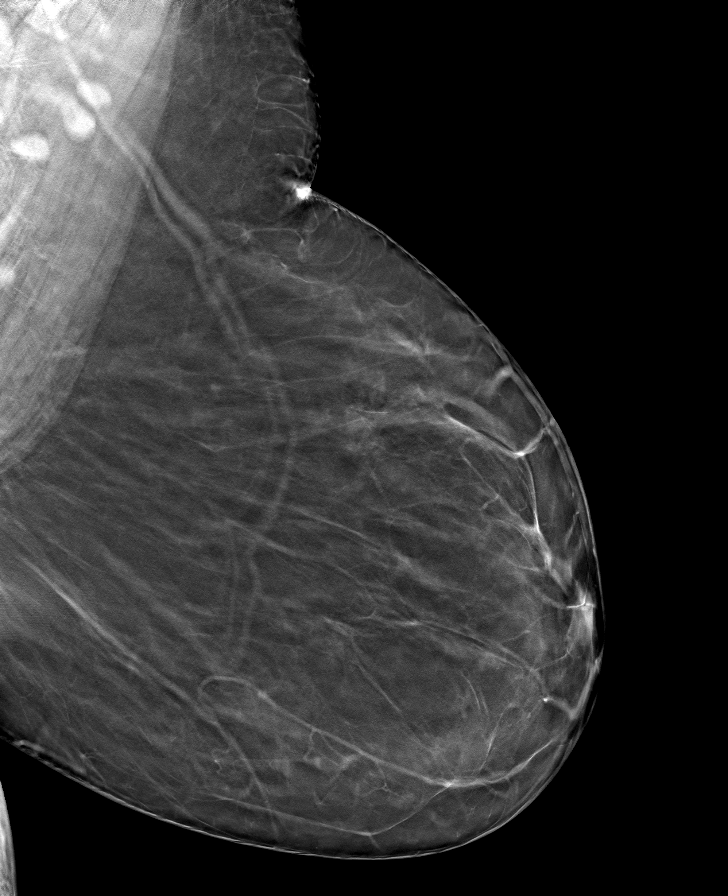

[R MLO tomo · tomo slice 41/81.0]
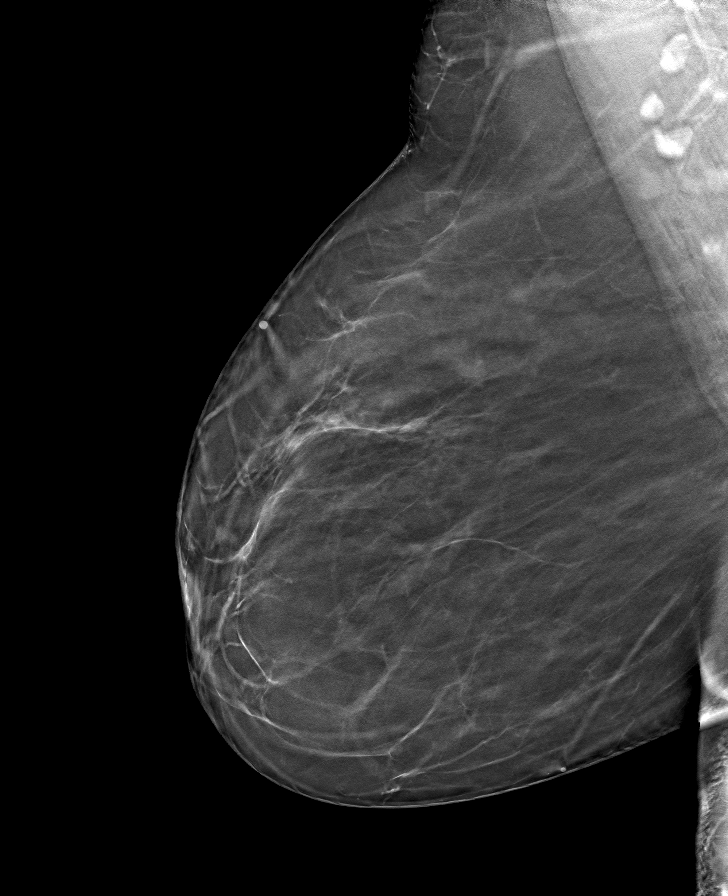

[L CC tomo · tomo slice 33/65.0]
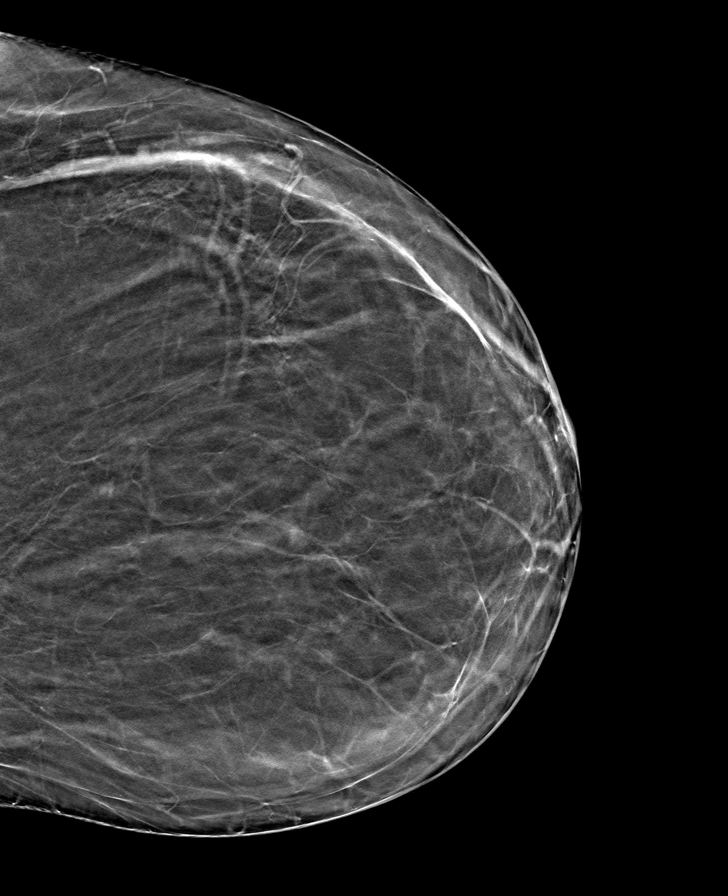

[R CC tomo · tomo slice 32/63.0]
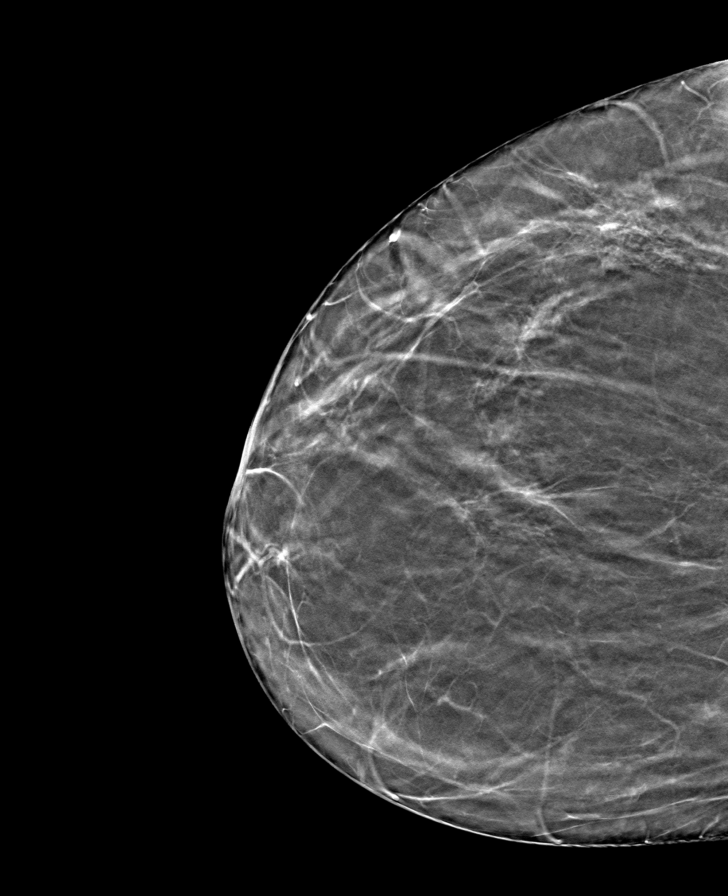

[8 of 24 positions shown; findings below may reference images not displayed]

ACR Breast Density Category b: There are scattered areas of
fibroglandular density.
FINDINGS: Examination demonstrates no focal asymmetry over the middle third of
the central right breast. Remainder of the right breast as well as
the left breast is unchanged.
IMPRESSION: No focal abnormality over the central right breast.

RECOMMENDATION:
Recommend continued annual bilateral screening mammographic
follow-up.

I have discussed the findings and recommendations with the patient.
If applicable, a reminder letter will be sent to the patient
regarding the next appointment.

BI-RADS CATEGORY  1: Negative.

## 2024-01-14 ENCOUNTER — Other Ambulatory Visit: Payer: Self-pay | Admitting: Certified Nurse Midwife

## 2024-01-14 DIAGNOSIS — Z1231 Encounter for screening mammogram for malignant neoplasm of breast: Secondary | ICD-10-CM

## 2024-01-20 ENCOUNTER — Other Ambulatory Visit: Payer: Self-pay | Admitting: Internal Medicine

## 2024-01-20 DIAGNOSIS — E782 Mixed hyperlipidemia: Secondary | ICD-10-CM

## 2024-01-20 DIAGNOSIS — Z0000 Encounter for general adult medical examination without abnormal findings: Secondary | ICD-10-CM

## 2024-01-28 ENCOUNTER — Ambulatory Visit
Admission: RE | Admit: 2024-01-28 | Discharge: 2024-01-28 | Disposition: A | Discharge status: Home / Self Care | Payer: Self-pay | Source: Ambulatory Visit | Attending: Internal Medicine | Admitting: Internal Medicine

## 2024-01-28 DIAGNOSIS — Z0000 Encounter for general adult medical examination without abnormal findings: Secondary | ICD-10-CM | POA: Insufficient documentation

## 2024-01-28 DIAGNOSIS — E782 Mixed hyperlipidemia: Secondary | ICD-10-CM | POA: Insufficient documentation

## 2024-03-30 ENCOUNTER — Ambulatory Visit
Admission: RE | Admit: 2024-03-30 | Discharge: 2024-03-30 | Disposition: A | Discharge status: Home / Self Care | Payer: Managed Care, Other (non HMO) | Source: Ambulatory Visit | Attending: Certified Nurse Midwife | Admitting: Certified Nurse Midwife

## 2024-03-30 DIAGNOSIS — Z1231 Encounter for screening mammogram for malignant neoplasm of breast: Secondary | ICD-10-CM | POA: Insufficient documentation
